# Patient Record
Sex: Female | Born: 1985 | Race: Black or African American | Hispanic: No | Marital: Single | Smoking: Never smoker
Health system: Southern US, Community
[De-identification: ages and names within clinical notes are randomized; demographics above are authoritative.]

## PROBLEM LIST (undated history)

## (undated) HISTORY — PX: ELBOW FRACTURE SURGERY: SHX616

---

## 2009-07-27 ENCOUNTER — Emergency Department (HOSPITAL_BASED_OUTPATIENT_CLINIC_OR_DEPARTMENT_OTHER): Admission: EM | Admit: 2009-07-27 | Discharge: 2009-07-27 | Payer: Self-pay | Admitting: Emergency Medicine

## 2009-07-27 ENCOUNTER — Ambulatory Visit: Payer: Self-pay | Admitting: Radiology

## 2012-12-24 ENCOUNTER — Emergency Department (HOSPITAL_BASED_OUTPATIENT_CLINIC_OR_DEPARTMENT_OTHER): Payer: Self-pay

## 2012-12-24 ENCOUNTER — Encounter (HOSPITAL_BASED_OUTPATIENT_CLINIC_OR_DEPARTMENT_OTHER): Payer: Self-pay | Admitting: *Deleted

## 2012-12-24 ENCOUNTER — Emergency Department (HOSPITAL_BASED_OUTPATIENT_CLINIC_OR_DEPARTMENT_OTHER)
Admission: EM | Admit: 2012-12-24 | Discharge: 2012-12-24 | Disposition: A | Discharge status: Home / Self Care | Payer: Self-pay | Attending: Emergency Medicine | Admitting: Emergency Medicine

## 2012-12-24 DIAGNOSIS — M654 Radial styloid tenosynovitis [de Quervain]: Secondary | ICD-10-CM | POA: Insufficient documentation

## 2012-12-24 MED ORDER — IBUPROFEN 800 MG PO TABS: 800.0000 mg | ORAL_TABLET | Freq: Three times a day (TID) | ORAL | Status: AC

## 2012-12-24 NOTE — ED Notes (Signed)
Left wrist has been hurting for the past 2 wqeeks, worsening now with movement. Denies recent injury

## 2012-12-25 NOTE — ED Provider Notes (Signed)
History     CSN: [id number]  Arrival date & time 12/24/12  4098   First MD Initiated Contact with Patient 12/24/12 2132      Chief Complaint  Patient presents with  . Wrist Pain    (Consider location/radiation/quality/duration/timing/severity/associated sxs/prior treatment) HPI Comments: L wrist pain that has been recurring over the past 2 weeks. No injury or fall. Worse with certain movements. No fever, chills, weakness, numbness, tingling.  No other joint pain.  R handed.   The history is provided by the patient.    History reviewed. No pertinent past medical history.  History reviewed. No pertinent past surgical history.  No family history on file.  History  Substance Use Topics  . Smoking status: Never Smoker   . Smokeless tobacco: Not on file  . Alcohol Use: No    OB History   Grav Para Term Preterm Abortions TAB SAB Ect Mult Living                  Review of Systems  Constitutional: Negative for fever.  Gastrointestinal: Negative for vomiting.  Musculoskeletal: Positive for myalgias and arthralgias.  Neurological: Negative for weakness and headaches.   A complete 10 system review of systems was obtained and all systems are negative except as noted in the HPI and PMH.   Allergies  Review of patient's allergies indicates no known allergies.  Home Medications   Current Outpatient Rx  Name  Route  Sig  Dispense  Refill  . ibuprofen (ADVIL,MOTRIN) 800 MG tablet   Oral   Take 1 tablet (800 mg total) by mouth 3 (three) times daily.   21 tablet   0     BP 131/82  Pulse 96  Temp(Src) 98.5 F (36.9 C) (Oral)  Resp 16  SpO2 99%  LMP 12/17/2012  Physical Exam  Constitutional: She is oriented to person, place, and time. She appears well-developed and well-nourished. No distress.  HENT:  Head: Normocephalic and atraumatic.  Mouth/Throat: Oropharynx is clear and moist. No oropharyngeal exudate.  Eyes: Conjunctivae and EOM are normal. Pupils are equal,  round, and reactive to light.  Neck: Normal range of motion.  Cardiovascular: Normal rate, regular rhythm and normal heart sounds.   Pulmonary/Chest: Effort normal and breath sounds normal. No respiratory distress.  Abdominal: Soft. There is no tenderness. There is no rebound and no guarding.  Musculoskeletal: Normal range of motion. She exhibits tenderness. She exhibits no edema.  TTP L radial wrist without deformity or erythema. +2 radial pulse, cardinal hand movements intact. Wrist flexion and extension intact. Finkelstein test positive.  Neurological: She is alert and oriented to person, place, and time. No cranial nerve deficit. She exhibits normal muscle tone. Coordination normal.    ED Course  Procedures (including critical care time)  Labs Reviewed - No data to display Dg Wrist Complete Left  12/24/2012  *RADIOLOGY REPORT*  Clinical Data: Left wrist pain for a few weeks, no known injury  LEFT WRIST - COMPLETE 3+ VIEW  Comparison: None  Findings: Bone mineralization normal. Joint spaces preserved. No fracture, dislocation, or bone destruction.  IMPRESSION: Normal exam.   Original Report Authenticated By: Ulyses Southward, M.D.      1. De Quervain's tenosynovitis       MDM  L wrist pain without injury. Nontoxic appearing. Vitals stable.  Probable de Quervain's tenosynovitis.  NSAIDs, splint, f/u sports med.        Glynn Octave, MD 12/25/12 1159

## 2014-08-12 IMAGING — CR DG WRIST COMPLETE 3+V*L*
4 series · 4 of 4 positions shown · non-contrast
Comparison: None

CLINICAL DATA: Left wrist pain for a few weeks, no known injury

LEFT WRIST - COMPLETE 3+ VIEW

[x wrist pa left]
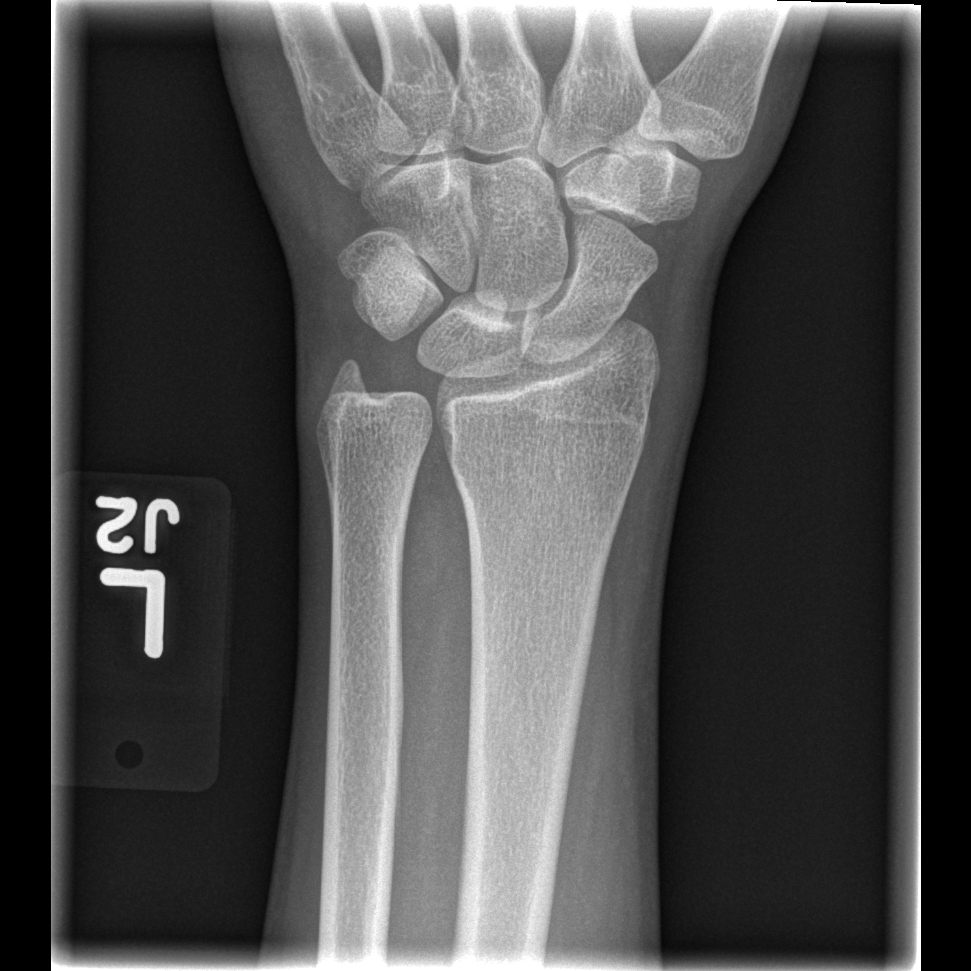

[x wrist obl left]
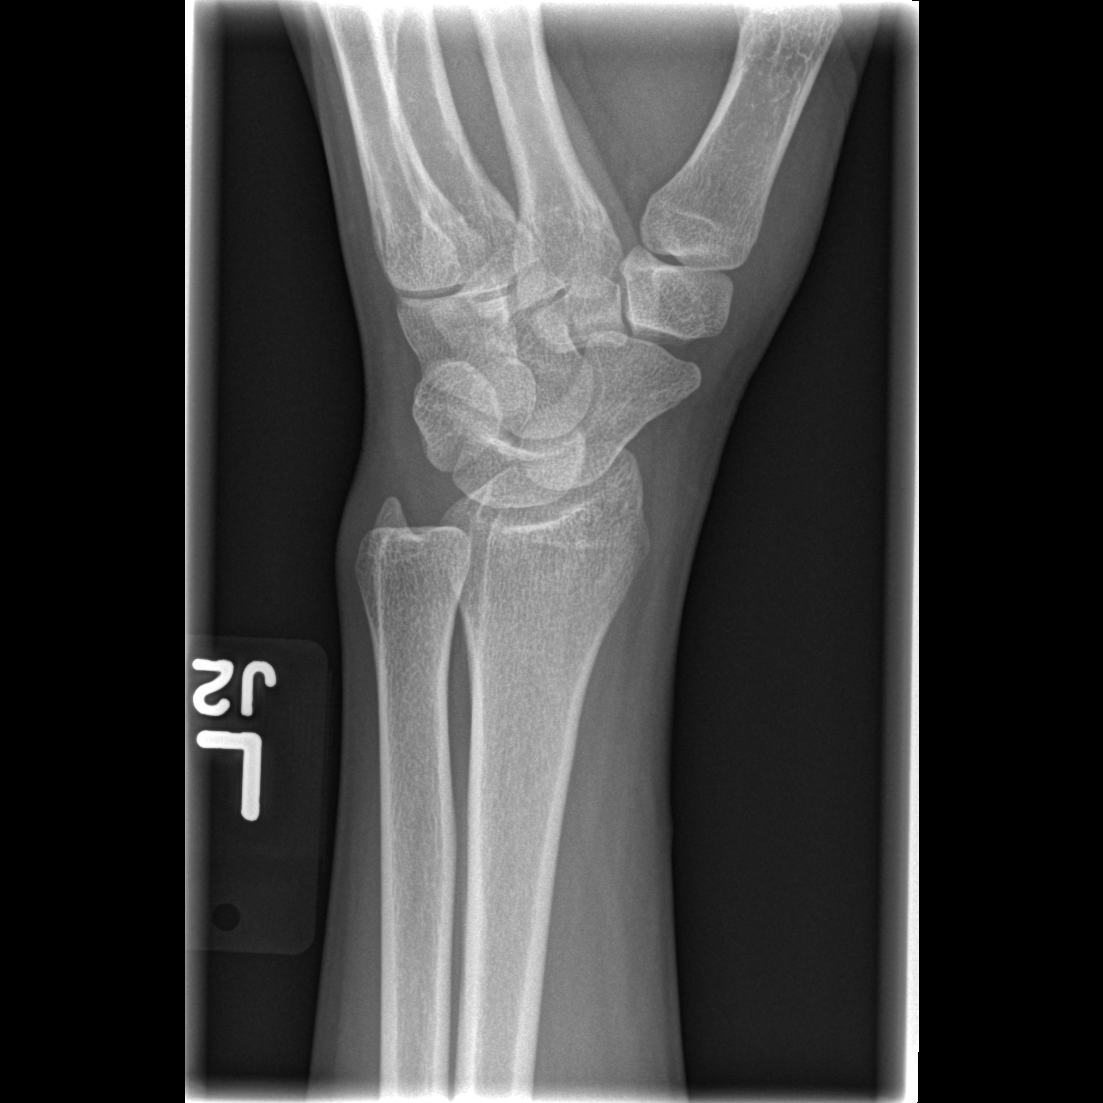

[x wrist lat left]
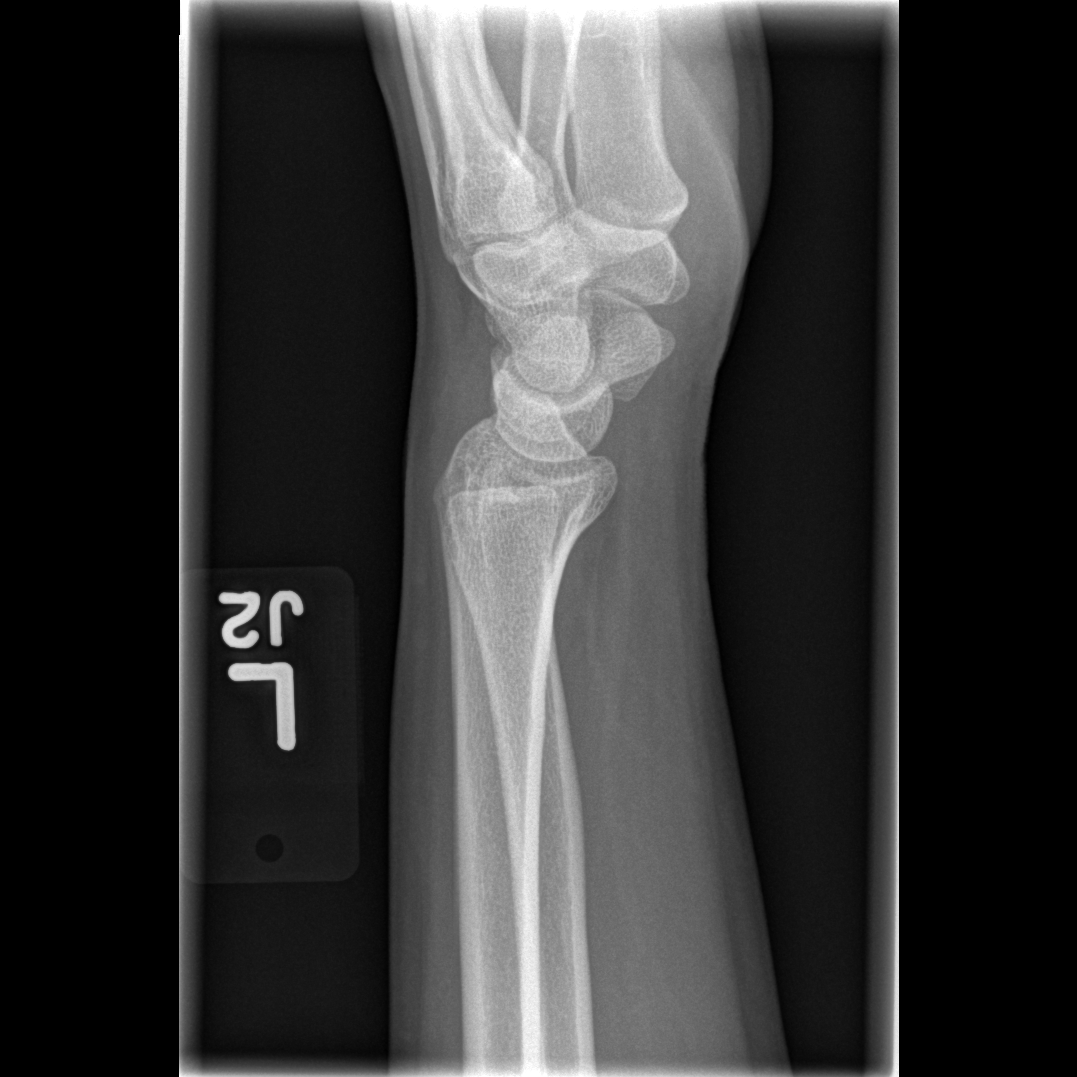

[x navicular]
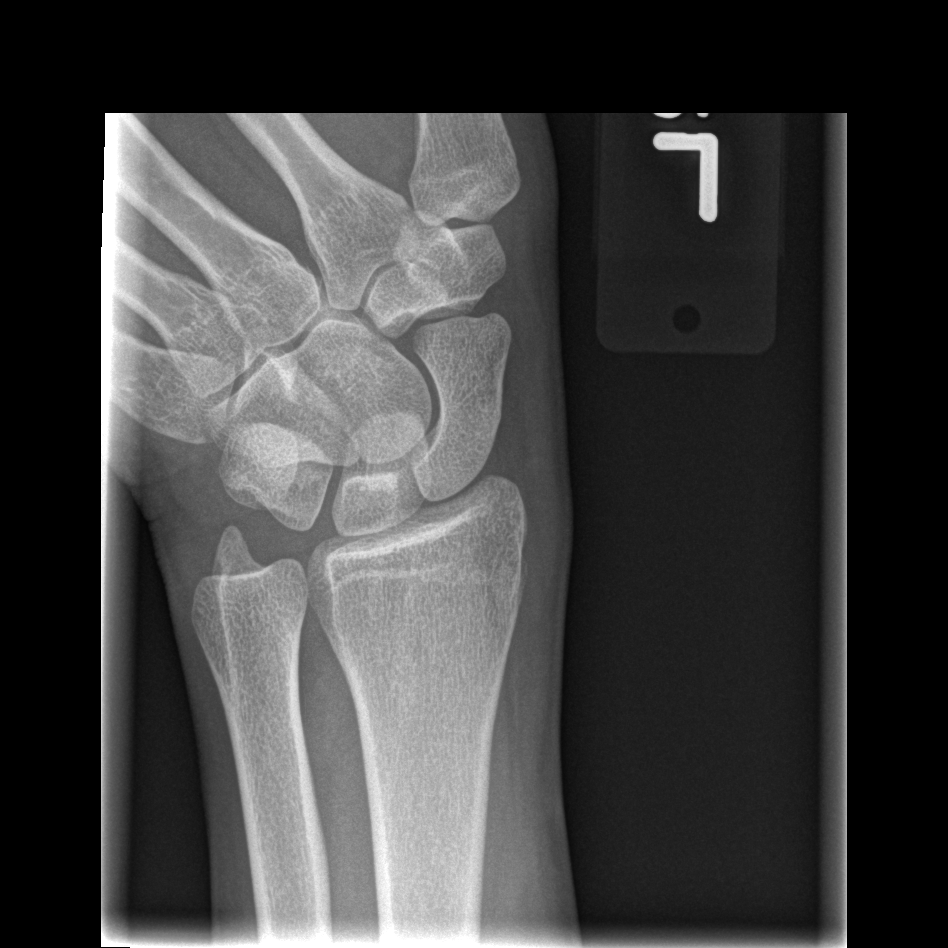

[4 of 4 positions shown; findings below may reference images not displayed]

FINDINGS: Bone mineralization normal.
Joint spaces preserved.
No fracture, dislocation, or bone destruction.
IMPRESSION: Normal exam.

## 2014-09-13 ENCOUNTER — Emergency Department (HOSPITAL_BASED_OUTPATIENT_CLINIC_OR_DEPARTMENT_OTHER)
Admission: EM | Admit: 2014-09-13 | Discharge: 2014-09-13 | Disposition: A | Discharge status: Home / Self Care | Payer: Medicaid Other | Attending: Emergency Medicine | Admitting: Emergency Medicine

## 2014-09-13 ENCOUNTER — Emergency Department (HOSPITAL_BASED_OUTPATIENT_CLINIC_OR_DEPARTMENT_OTHER): Payer: Medicaid Other

## 2014-09-13 ENCOUNTER — Encounter (HOSPITAL_BASED_OUTPATIENT_CLINIC_OR_DEPARTMENT_OTHER): Payer: Self-pay | Admitting: *Deleted

## 2014-09-13 DIAGNOSIS — Z791 Long term (current) use of non-steroidal anti-inflammatories (NSAID): Secondary | ICD-10-CM | POA: Diagnosis not present

## 2014-09-13 DIAGNOSIS — R059 Cough, unspecified: Secondary | ICD-10-CM

## 2014-09-13 DIAGNOSIS — R05 Cough: Secondary | ICD-10-CM

## 2014-09-13 DIAGNOSIS — J40 Bronchitis, not specified as acute or chronic: Secondary | ICD-10-CM

## 2014-09-13 DIAGNOSIS — J209 Acute bronchitis, unspecified: Secondary | ICD-10-CM | POA: Insufficient documentation

## 2014-09-13 MED ORDER — AZITHROMYCIN 250 MG PO TABS: 250.0000 mg | ORAL_TABLET | Freq: Every day | ORAL | Status: DC

## 2014-09-13 MED ORDER — ALBUTEROL SULFATE HFA 108 (90 BASE) MCG/ACT IN AERS
2.0000 | INHALATION_SPRAY | Freq: Once | RESPIRATORY_TRACT | Status: AC
  Administered 2014-09-13: 2 via RESPIRATORY_TRACT
  Filled 2014-09-13: qty 6.7

## 2014-09-13 NOTE — ED Provider Notes (Signed)
CSN: [id number]     Arrival date & time 09/13/14  1614 History   First MD Initiated Contact with Patient 09/13/14 1716     Chief Complaint  Patient presents with  . Cough     (Consider location/radiation/quality/duration/timing/severity/associated sxs/prior Treatment) Patient is a 28 y.o. female presenting with cough. The history is provided by the patient. No language interpreter was used.  Cough Cough characteristics:  Productive Sputum characteristics:  Nondescript Severity:  Moderate Onset quality:  Gradual Timing:  Constant Progression:  Worsening Chronicity:  New Smoker: no   Context: upper respiratory infection   Relieved by:  Nothing Worsened by:  Nothing tried Risk factors: recent infection    Pt complains of a cough, sore throat and runny nose History reviewed. No pertinent past medical history. Past Surgical History  Procedure Laterality Date  . Elbow fracture surgery     No family history on file. History  Substance Use Topics  . Smoking status: Never Smoker   . Smokeless tobacco: Never Used  . Alcohol Use: No   OB History    No data available     Review of Systems  Respiratory: Positive for cough.   All other systems reviewed and are negative.     Allergies  Review of patient's allergies indicates no known allergies.  Home Medications   Prior to Admission medications   Medication Sig Start Date End Date Taking? Authorizing Provider  ibuprofen (ADVIL,MOTRIN) 800 MG tablet Take 1 tablet (800 mg total) by mouth 3 (three) times daily. 12/24/12   Glynn OctaveStephen Rancour, MD   BP 144/89 mmHg  Pulse 86  Temp(Src) 99 F (37.2 C) (Oral)  Resp 18  Ht 5\' 5"  (1.651 m)  Wt 165 lb (74.844 kg)  BMI 27.46 kg/m2  SpO2 100%  LMP 08/13/2014 Physical Exam  Constitutional: She is oriented to person, place, and time. She appears well-developed and well-nourished.  HENT:  Head: Normocephalic and atraumatic.  Right Ear: External ear normal.  Left Ear: External  ear normal.  Nose: Nose normal.  Mouth/Throat: Oropharynx is clear and moist.  Eyes: Conjunctivae are normal. Pupils are equal, round, and reactive to light.  Neck: Normal range of motion. Neck supple.  Cardiovascular: Normal rate and regular rhythm.   Pulmonary/Chest: Effort normal.  Neurological: She is alert and oriented to person, place, and time. She has normal reflexes.  Skin: Skin is warm.  Psychiatric: She has a normal mood and affect.  Nursing note and vitals reviewed.   ED Course  Procedures (including critical care time) Labs Review Labs Reviewed - No data to display  Imaging Review No results found.   EKG Interpretation None     Albuterol inhaler  Zithromax  MDM   Final diagnoses:  Bronchitis        Elson AreasLeslie K Maurina Fawaz, PA-C 09/13/14 Rickey [id number]  Mirian MoMatthew Gentry, MD 09/19/14 [phone]

## 2014-09-13 NOTE — Discharge Instructions (Signed)

## 2014-09-13 NOTE — ED Notes (Signed)
PA at bedside.

## 2014-09-13 NOTE — ED Notes (Signed)
Cough, runny nose, sore throat since friday

## 2015-01-19 ENCOUNTER — Emergency Department (HOSPITAL_BASED_OUTPATIENT_CLINIC_OR_DEPARTMENT_OTHER)
Admission: EM | Admit: 2015-01-19 | Discharge: 2015-01-19 | Disposition: A | Discharge status: Home / Self Care | Payer: Medicaid Other | Attending: Emergency Medicine | Admitting: Emergency Medicine

## 2015-01-19 DIAGNOSIS — Z791 Long term (current) use of non-steroidal anti-inflammatories (NSAID): Secondary | ICD-10-CM | POA: Insufficient documentation

## 2015-01-19 DIAGNOSIS — K088 Other specified disorders of teeth and supporting structures: Secondary | ICD-10-CM | POA: Insufficient documentation

## 2015-01-19 DIAGNOSIS — K0889 Other specified disorders of teeth and supporting structures: Secondary | ICD-10-CM

## 2015-01-19 MED ORDER — BUPIVACAINE-EPINEPHRINE (PF) 0.5% -1:200000 IJ SOLN: 1.8000 mL | Freq: Once | INTRAMUSCULAR | Status: DC

## 2015-01-19 MED ORDER — PENICILLIN V POTASSIUM 500 MG PO TABS: 500.0000 mg | ORAL_TABLET | Freq: Four times a day (QID) | ORAL | Status: AC

## 2015-01-19 MED ORDER — BUPIVACAINE-EPINEPHRINE (PF) 0.5% -1:200000 IJ SOLN
INTRAMUSCULAR | Status: AC
  Filled 2015-01-19: qty 1.8

## 2015-01-19 MED ORDER — TRAMADOL HCL 50 MG PO TABS: 50.0000 mg | ORAL_TABLET | Freq: Four times a day (QID) | ORAL | Status: AC | PRN

## 2015-01-19 NOTE — ED Provider Notes (Signed)
CSN: [id number]     Arrival date & time 01/19/15  1025 History   First MD Initiated Contact with Patient 01/19/15 1200     No chief complaint on file.    (Consider location/radiation/quality/duration/timing/severity/associated sxs/prior Treatment) HPI Mackenzie Serrano is a 29 y.o. female who comes in for evaluation of dental pain. Patient states since Friday she has had discomfort in her left jaw that she attributes to dental pain in her left upper and lower molars. She called her dentist but was unable to get an appointment before April 20. She has tried ibuprofen, salt water gargles and peroxide without relief. She rates her discomfort as a constant aching sensation that is 7/10. She denies any fevers, drooling,difficulty breathing, difficulty swallowing, chest pain, sore throat. No other aggravating or modifying factors.  No past medical history on file. Past Surgical History  Procedure Laterality Date  . Elbow fracture surgery     No family history on file. History  Substance Use Topics  . Smoking status: Never Smoker   . Smokeless tobacco: Never Used  . Alcohol Use: No   OB History    No data available     Review of Systems  Constitutional: Negative for fever.  HENT: Positive for dental problem. Negative for drooling.   Respiratory: Negative for shortness of breath.   Cardiovascular: Negative for chest pain.  Skin: Negative for rash.      Allergies  Review of patient's allergies indicates no known allergies.  Home Medications   Prior to Admission medications   Medication Sig Start Date End Date Taking? Authorizing Provider  ibuprofen (ADVIL,MOTRIN) 800 MG tablet Take 1 tablet (800 mg total) by mouth 3 (three) times daily. 12/24/12   Glynn OctaveStephen Rancour, MD  penicillin v potassium (VEETID) 500 MG tablet Take 1 tablet (500 mg total) by mouth 4 (four) times daily. 01/19/15 01/26/15  Joycie PeekBenjamin Raahil Ong, PA-C  traMADol (ULTRAM) 50 MG tablet Take 1 tablet (50 mg total) by mouth every 6  (six) hours as needed. 01/19/15   Colisha Redler, PA-C   BP 130/71 mmHg  Pulse 64  Temp(Src) 98.2 F (36.8 C) (Oral)  Resp 14  Ht 5\' 5"  (1.651 m)  Wt 155 lb (70.308 kg)  BMI 25.79 kg/m2  SpO2 100% Physical Exam  Constitutional:  Awake, alert, nontoxic appearance.  HENT:  Head: Atraumatic.  Discomfort located to lower left molars and premolars. Active caries. Mucous membranes are moist. No unilateral tonsillar swelling, uvula midline, no glossal swelling or elevation. No trismus. No fluctuance or evidence of a drainable abscess. No other evidence of emergent infection, Retropharyngeal or Peritonsillar abscess, Ludwig or Vincents angina. Tolerating secretions well. Patent airway   Eyes: Right eye exhibits no discharge. Left eye exhibits no discharge.  Neck: Neck supple.  Pulmonary/Chest: Effort normal. She exhibits no tenderness.  Abdominal: Soft. There is no tenderness. There is no rebound.  Musculoskeletal: She exhibits no tenderness.  Baseline ROM, no obvious new focal weakness.  Neurological:  Mental status and motor strength appears baseline for patient and situation.  Skin: No rash noted.  Psychiatric: She has a normal mood and affect.  Nursing note and vitals reviewed.   ED Course  Procedures (including critical care time) NERVE BLOCK Performed by: Sharlene Mottsartner, Dax Murguia W Consent: Verbal consent obtained. Required items: required blood products, implants, devices, and special equipment available Time out: Immediately prior to procedure a "time out" was called to verify the correct patient, procedure, equipment, support staff and site/side marked as required.  Indication: dental  pain Nerve block body site: left inferior alveolar  Preparation: Patient was prepped and draped in the usual sterile fashion. Needle gauge: 24 G Location technique: anatomical landmarks  Local anesthetic: bupivacaine  Anesthetic total: bupivacaine 1.8 ml  Outcome: pain improved Patient  tolerance: Patient tolerated the procedure well with no immediate complications.  Labs Review Labs Reviewed - No data to display  Imaging Review No results found.   EKG Interpretation None     Meds given in ED:  Medications  bupivacaine-epinephrine (MARCAINE W/ EPI) 0.5% -1:200000 injection 1.8 mL (not administered)    New Prescriptions   PENICILLIN V POTASSIUM (VEETID) 500 MG TABLET    Take 1 tablet (500 mg total) by mouth 4 (four) times daily.   TRAMADOL (ULTRAM) 50 MG TABLET    Take 1 tablet (50 mg total) by mouth every 6 (six) hours as needed.   Filed Vitals:   01/19/15 1029  BP: 130/71  Pulse: 64  Temp: 98.2 F (36.8 C)  TempSrc: Oral  Resp: 14  Height:  (1.651 m)  Weight: 155 lb (70.308 kg)  SpO2: 100%    MDM  Vitals stable - WNL -afebrile Pt resting comfortably in ED. Reports resolution of pain following oral nerve block bleeding in ED. PE--grossly benign physical exam/dental exam. No evidence of retropharyngeal, peritonsillar abscess, Vincent's or Ludwig angina.  DDX--patient discharge with antibiotics, short course tramadol for associated dental discomfort. Encouraged to return to ibuprofen, salt water gargles for symptomatic relief at home. Encouraged her to follow-up with her dentist for regularly scheduled appointment on April 20. Return to ED for worsening symptoms.  I discussed all relevant lab findings and imaging results with pt and they verbalized understanding. Discussed f/u with PCP within 48 hrs and return precautions, pt very amenable to plan.  Final diagnoses:  Pain, dental        Joycie Peek, PA-C 01/19/15 1304  Vanetta Mulders, MD 01/20/15 [phone]

## 2015-01-19 NOTE — ED Notes (Signed)
Presents to ED for c/o "toothache" states has swelling on gums since friday

## 2015-01-19 NOTE — Discharge Instructions (Signed)
Dental Pain °A tooth ache may be caused by cavities (tooth decay). Cavities expose the nerve of the tooth to air and hot or cold temperatures. It may come from an infection or abscess (also called a boil or furuncle) around your tooth. It is also often caused by dental caries (tooth decay). This causes the pain you are having. °DIAGNOSIS  °Your caregiver can diagnose this problem by exam. °TREATMENT  °· If caused by an infection, it may be treated with medications which kill germs (antibiotics) and pain medications as prescribed by your caregiver. Take medications as directed. °· Only take over-the-counter or prescription medicines for pain, discomfort, or fever as directed by your caregiver. °· Whether the tooth ache today is caused by infection or dental disease, you should see your dentist as soon as possible for further care. °SEEK MEDICAL CARE IF: °The exam and treatment you received today has been provided on an emergency basis only. This is not a substitute for complete medical or dental care. If your problem worsens or new problems (symptoms) appear, and you are unable to meet with your dentist, call or return to this location. °SEEK IMMEDIATE MEDICAL CARE IF:  °· You have a fever. °· You develop redness and swelling of your face, jaw, or neck. °· You are unable to open your mouth. °· You have severe pain uncontrolled by pain medicine. °MAKE SURE YOU:  °· Understand these instructions. °· Will watch your condition. °· Will get help right away if you are not doing well or get worse. °Document Released: 10/01/2005 Document Revised: 12/24/2011 Document Reviewed: 05/19/2008 °ExitCare® Patient Information ©2015 [employer]. This information is not intended to replace advice given to you by your health care provider. Make sure you discuss any questions you have with your health care provider. ° °Dental Care and Dentist Visits °Dental care supports good overall health. Regular dental visits can also help you  avoid dental pain, bleeding, infection, and other more serious health problems in the future. It is important to keep the mouth healthy because diseases in the teeth, gums, and other oral tissues can spread to other areas of the body. Some problems, such as diabetes, heart disease, and pre-term labor have been associated with poor oral health.  °See your dentist every 6 months. If you experience emergency problems such as a toothache or broken tooth, go to the dentist right away. If you see your dentist regularly, you may catch problems early. It is easier to be treated for problems in the early stages.  °WHAT TO EXPECT AT A DENTIST VISIT  °Your dentist will look for many common oral health problems and recommend proper treatment. At your regular dental visit, you can expect: °· Gentle cleaning of the teeth and gums. This includes scraping and polishing. This helps to remove the sticky substance around the teeth and gums (plaque). Plaque forms in the mouth shortly after eating. Over time, plaque hardens on the teeth as tartar. If tartar is not removed regularly, it can cause problems. Cleaning also helps remove stains. °· Periodic X-rays. These pictures of the teeth and supporting bone will help your dentist assess the health of your teeth. °· Periodic fluoride treatments. Fluoride is a natural mineral shown to help strengthen teeth. Fluoride treatment involves applying a fluoride gel or varnish to the teeth. It is most commonly done in children. °· Examination of the mouth, tongue, jaws, teeth, and gums to look for any oral health problems, such as: °¨ Cavities (dental caries). This is   decay on the tooth caused by plaque, sugar, and acid in the mouth. It is best to catch a cavity when it is small.  Inflammation of the gums caused by plaque buildup (gingivitis).  Problems with the mouth or malformed or misaligned teeth.  Oral cancer or other diseases of the soft tissues or jaws. KEEP YOUR TEETH AND GUMS  HEALTHY For healthy teeth and gums, follow these general guidelines as well as your dentist's specific advice:  Have your teeth professionally cleaned at the dentist every 6 months.  Brush twice daily with a fluoride toothpaste.  Floss your teeth daily.  Ask your dentist if you need fluoride supplements, treatments, or fluoride toothpaste.  Eat a healthy diet. Reduce foods and drinks with added sugar.  Avoid smoking. TREATMENT FOR ORAL HEALTH PROBLEMS If you have oral health problems, treatment varies depending on the conditions present in your teeth and gums.  Your caregiver will most likely recommend good oral hygiene at each visit.  For cavities, gingivitis, or other oral health disease, your caregiver will perform a procedure to treat the problem. This is typically done at a separate appointment. Sometimes your caregiver will refer you to another dental specialist for specific tooth problems or for surgery. SEEK IMMEDIATE DENTAL CARE IF:  You have pain, bleeding, or soreness in the gum, tooth, jaw, or mouth area.  A permanent tooth becomes loose or separated from the gum socket.  You experience a blow or injury to the mouth or jaw area. Document Released: 06/13/2011 Document Revised: 12/24/2011 Document Reviewed: 06/13/2011 [facility]Exit[facility] Patient Information 2015 [place], [place]. This information is not intended to replace advice given to you by your health care provider. Make sure you discuss any questions you have with your health care provider.   Please take your medications as directed. Please complete your antibiotics. Do not take your pain medicines prior to driving or operating machinery. Follow up with your dentist for definitive care. Return to ED for new or worsening symptoms.

## 2015-01-19 NOTE — ED Notes (Signed)
Pt states has tried Ibuprofen, warm saltwater, even peroxide for control of pain, without relief, call primary dentist today, was told appoint unavailable till later part of april

## 2015-01-19 NOTE — ED Notes (Signed)
No difficulty in speech or swallowing noted, no visible facial swelling noted at this assessment

## 2016-05-01 IMAGING — CR DG CHEST 2V
2 series · 2 of 2 positions shown · non-contrast
Comparison: 07/27/2009.

CLINICAL DATA: Patient states she has had a cough, runny nose and
sore throat since [REDACTED].

EXAM:
CHEST  2 VIEW

[w chest pa]
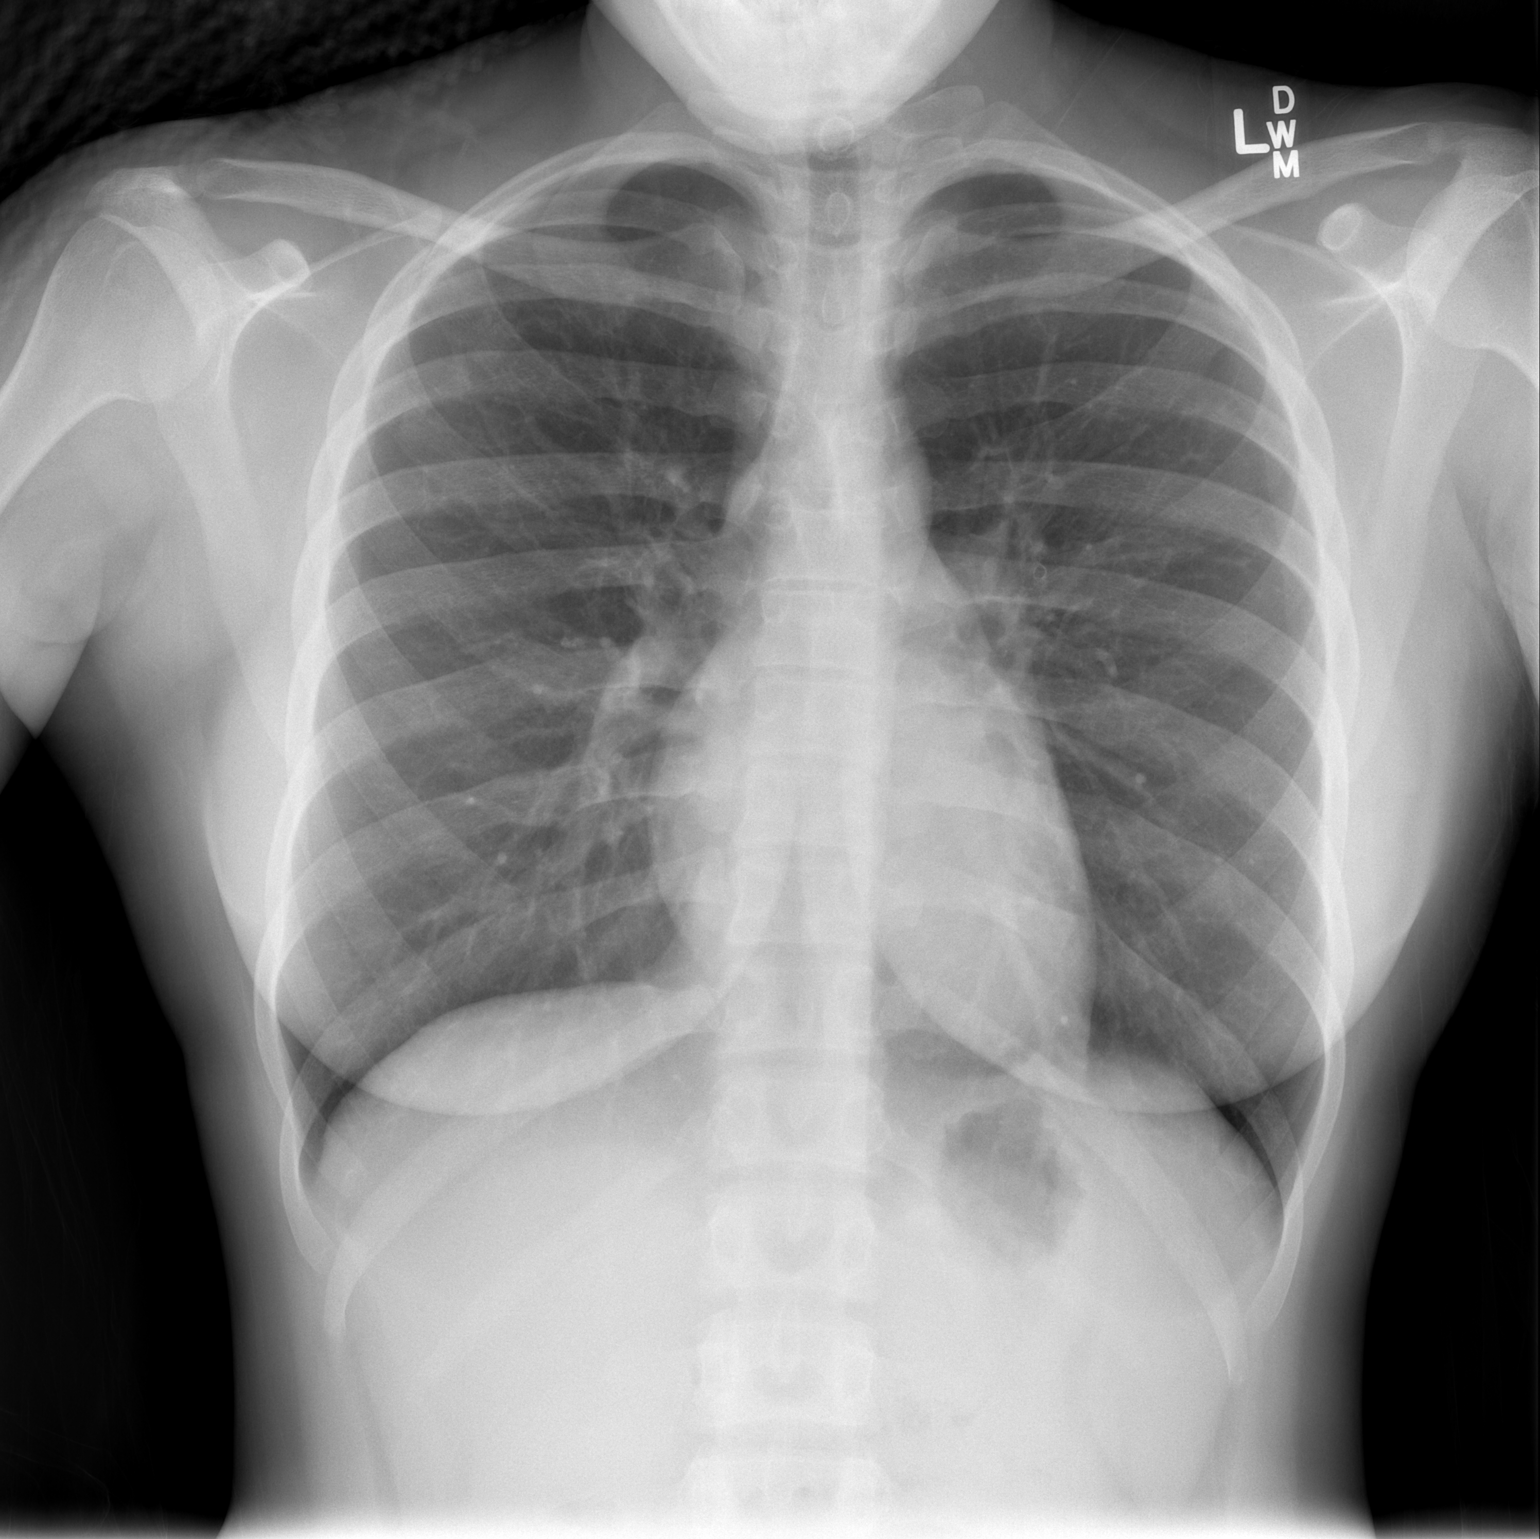

[w chest lat]
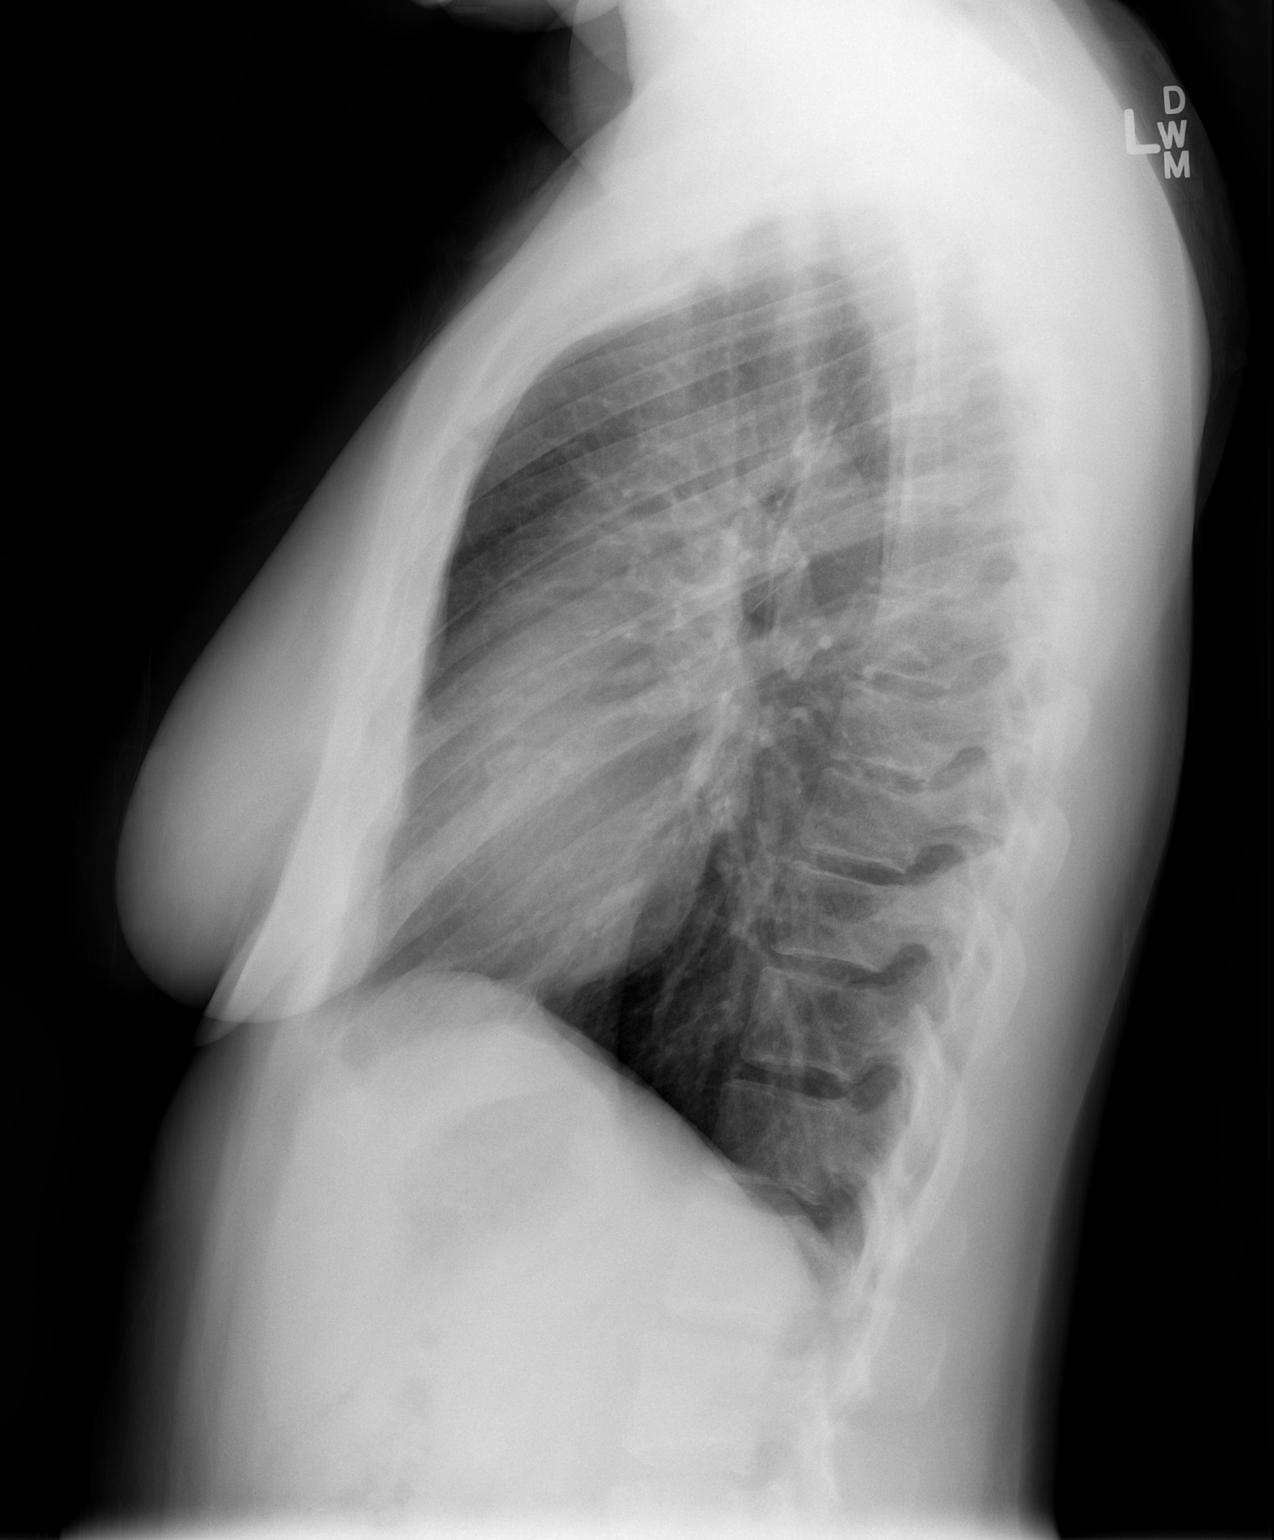

[2 of 2 positions shown; findings below may reference images not displayed]

FINDINGS: Trachea is midline. Heart size normal. Lungs are clear. No pleural
fluid.
IMPRESSION: Negative.

## 2017-01-24 ENCOUNTER — Encounter (HOSPITAL_COMMUNITY): Payer: Self-pay | Admitting: Emergency Medicine

## 2017-01-24 ENCOUNTER — Ambulatory Visit (HOSPITAL_COMMUNITY)
Admission: EM | Admit: 2017-01-24 | Discharge: 2017-01-24 | Disposition: A | Discharge status: Home / Self Care | Payer: Medicaid Other | Attending: Family Medicine | Admitting: Family Medicine

## 2017-01-24 DIAGNOSIS — S50361A Insect bite (nonvenomous) of right elbow, initial encounter: Secondary | ICD-10-CM

## 2017-01-24 DIAGNOSIS — L539 Erythematous condition, unspecified: Secondary | ICD-10-CM

## 2017-01-24 DIAGNOSIS — W57XXXA Bitten or stung by nonvenomous insect and other nonvenomous arthropods, initial encounter: Secondary | ICD-10-CM

## 2017-01-24 DIAGNOSIS — L299 Pruritus, unspecified: Secondary | ICD-10-CM

## 2017-01-24 MED ORDER — TRIAMCINOLONE ACETONIDE 0.1 % EX CREA: 1.0000 "application " | TOPICAL_CREAM | Freq: Two times a day (BID) | CUTANEOUS | 0 refills | Status: AC

## 2017-01-24 NOTE — ED Triage Notes (Signed)
Here for poss insect bite to right arm onset yest  Reports she felt a sting but did not see the insect  Sx today include: swelling, redness and localized fever  Denies dyspnea, dysphagia  Has applied OTC hydrocortisone cream w/temp relief.   A&O x4... NAD

## 2017-01-24 NOTE — Discharge Instructions (Signed)
Apply the triamcinolone cream to the affected area twice a day. He may also apply Benadryl cream every 4 hours if needed for itching and swelling. Also apply cold compresses. This should go away saying. She did develop generalized swelling, rash, hives, trouble breathing, wheezing or swelling in the mouth or tongue he should seek medical attention promptly.

## 2017-01-24 NOTE — ED Provider Notes (Signed)
CSN: [id number]     Arrival date & time 01/24/17  1107 History   First MD Initiated Contact with Patient 01/24/17 1148     Chief Complaint  Patient presents with  . Insect Bite   (Consider location/radiation/quality/duration/timing/severity/associated sxs/prior Treatment) 31 year old female states she was at work early yesterday morning around 7 AM and she felt some sort of interesting to the right elbow. Shortly afterwards there was growing erythema and itching around the right elbow. The itching and redness has persisted through today. Denies systemic symptoms. No swelling, hives, trouble breathing, cough. He did apply cold compresses which helped significantly as well as over-the-counter cortisone cream which also helped temporarily.      History reviewed. No pertinent past medical history. Past Surgical History:  Procedure Laterality Date  . ELBOW FRACTURE SURGERY     No family history on file. Social History  Substance Use Topics  . Smoking status: Never Smoker  . Smokeless tobacco: Never Used  . Alcohol use Yes   OB History    No data available     Review of Systems  Constitutional: Negative.   HENT: Negative.  Negative for congestion and rhinorrhea.   Respiratory: Negative.  Negative for cough, shortness of breath and wheezing.   Cardiovascular: Negative for chest pain.  Skin: Positive for color change. Negative for wound.  Neurological: Negative.   All other systems reviewed and are negative.   Allergies  Patient has no known allergies.  Home Medications   Prior to Admission medications   Medication Sig Start Date End Date Taking? Authorizing Provider  ibuprofen (ADVIL,MOTRIN) 800 MG tablet Take 1 tablet (800 mg total) by mouth 3 (three) times daily. 12/24/12   Glynn Octave, MD  traMADol (ULTRAM) 50 MG tablet Take 1 tablet (50 mg total) by mouth every 6 (six) hours as needed. 01/19/15   Joycie Peek, PA-C  triamcinolone cream (KENALOG) 0.1 % Apply 1  application topically 2 (two) times daily. 01/24/17   Hayden Rasmussen, NP   Meds Ordered and Administered this Visit  Medications - No data to display  BP 137/89 (BP Location: Right Arm)   Pulse 91   Temp 98.6 F (37 C) (Oral)   Resp 16   LMP 01/24/2017   SpO2 100%  No data found.   Physical Exam  Constitutional: She is oriented to person, place, and time. She appears well-developed and well-nourished. No distress.  HENT:  No intraoral edema. Voice is normal.  Eyes: EOM are normal.  Neck: Neck supple.  Cardiovascular: Normal rate and regular rhythm.   Pulmonary/Chest: Effort normal. No respiratory distress. She has no wheezes.  Musculoskeletal: She exhibits no edema.  Neurological: She is alert and oriented to person, place, and time. She exhibits normal muscle tone.  Skin: Skin is warm and dry. No rash noted.  There is a circle of cutaneous erythema surrounding the right elbow extending into the proximal forearm and distal upper arm. No lymphangitis. No tenderness or pain. No open skin lesions are visible. No local swelling.  Psychiatric: She has a normal mood and affect.  Nursing note and vitals reviewed.   Urgent Care Course     Procedures (including critical care time)  Labs Review Labs Reviewed - No data to display  Imaging Review No results found.   Visual Acuity Review  Right Eye Distance:   Left Eye Distance:   Bilateral Distance:    Right Eye Near:   Left Eye Near:    Bilateral Near:  MDM   1. Insect bite, initial encounter    Apply the triamcinolone cream to the affected area twice a day. He may also apply Benadryl cream every 4 hours if needed for itching and swelling. Also apply cold compresses. This should go away saying. She did develop generalized swelling, rash, hives, trouble breathing, wheezing or swelling in the mouth or tongue he should seek medical attention promptly. Meds ordered this encounter  Medications  . triamcinolone cream  (KENALOG) 0.1 %    Sig: Apply 1 application topically 2 (two) times daily.    Dispense:  15 g    Refill:  0    Order Specific Question:   Supervising Provider    Answer:   Lonia Blood       Hayden Rasmussen, NP 01/24/17 1200

## 2017-01-31 ENCOUNTER — Emergency Department (HOSPITAL_BASED_OUTPATIENT_CLINIC_OR_DEPARTMENT_OTHER)
Admission: EM | Admit: 2017-01-31 | Discharge: 2017-01-31 | Disposition: A | Discharge status: Home / Self Care | Payer: Medicaid Other | Attending: Emergency Medicine | Admitting: Emergency Medicine

## 2017-01-31 ENCOUNTER — Encounter (HOSPITAL_BASED_OUTPATIENT_CLINIC_OR_DEPARTMENT_OTHER): Payer: Self-pay | Admitting: *Deleted

## 2017-01-31 DIAGNOSIS — Z79899 Other long term (current) drug therapy: Secondary | ICD-10-CM | POA: Insufficient documentation

## 2017-01-31 DIAGNOSIS — F1290 Cannabis use, unspecified, uncomplicated: Secondary | ICD-10-CM | POA: Insufficient documentation

## 2017-01-31 DIAGNOSIS — L03113 Cellulitis of right upper limb: Secondary | ICD-10-CM | POA: Insufficient documentation

## 2017-01-31 MED ORDER — CEPHALEXIN 500 MG PO CAPS: 500.0000 mg | ORAL_CAPSULE | Freq: Four times a day (QID) | ORAL | 0 refills | Status: AC

## 2017-01-31 MED ORDER — DIPHENHYDRAMINE HCL 50 MG/ML IJ SOLN
25.0000 mg | Freq: Once | INTRAMUSCULAR | Status: AC
  Administered 2017-01-31: 25 mg via INTRAMUSCULAR
  Filled 2017-01-31: qty 1

## 2017-01-31 MED ORDER — PREDNISONE 10 MG PO TABS: 20.0000 mg | ORAL_TABLET | Freq: Every day | ORAL | 0 refills | Status: AC

## 2017-01-31 MED ORDER — DEXAMETHASONE SODIUM PHOSPHATE 10 MG/ML IJ SOLN
10.0000 mg | Freq: Once | INTRAMUSCULAR | Status: AC
  Administered 2017-01-31: 10 mg via INTRAMUSCULAR
  Filled 2017-01-31: qty 1

## 2017-01-31 NOTE — ED Provider Notes (Signed)
[facility] Note   CSN: [id number] Arrival date & time: 01/31/17  1703  By signing my name below, I, Mackenzie Serrano, attest that this documentation has been prepared under the direction and in the presence of non-physician practitioner, Graciella Freer, PA-C. Electronically Signed: Nelwyn Serrano, Scribe. 01/31/2017. 5:35 PM.  History   Chief Complaint Chief Complaint  Patient presents with  . Insect Bite   The history is provided by the patient. No language interpreter was used.    HPI Comments:  Mackenzie Serrano is an otherwise healthy 31 y.o. female who presents to the Emergency Department complaining of worsening rash to her left arm after onset >1 week. Pt was seen previously for her symptoms, which were initially on her right arm and back, and given Kenalog. She has been compliant with her prescribed medication with some relief to her right arm, but new areas of erythema have formed on her left arm. She reports associated color change, drainage, pain, and itching to the area. Pt has also tried OTC benedryl and cortisone cream for her symptoms with no relief. She reports she has experienced similar symptoms before after a bug bite. Denies any fever, facial swelling, LE swelling or any other symptoms.   History reviewed. No pertinent past medical history.  There are no active problems to display for this patient.   Past Surgical History:  Procedure Laterality Date  . ELBOW FRACTURE SURGERY      OB History    No data available       Home Medications    Prior to Admission medications   Medication Sig Start Date End Date Taking? Authorizing Provider  cephALEXin (KEFLEX) 500 MG capsule Take 1 capsule (500 mg total) by mouth 4 (four) times daily. 01/31/17   Maxwell Caul, PA-C  ibuprofen (ADVIL,MOTRIN) 800 MG tablet Take 1 tablet (800 mg total) by mouth 3 (three) times daily. 12/24/12   Glynn Octave, MD  predniSONE (DELTASONE) 10 MG tablet Take 2 tablets  (20 mg total) by mouth daily. 01/31/17 02/04/17  Maxwell Caul, PA-C  traMADol (ULTRAM) 50 MG tablet Take 1 tablet (50 mg total) by mouth every 6 (six) hours as needed. 01/19/15   Joycie Peek, PA-C  triamcinolone cream (KENALOG) 0.1 % Apply 1 application topically 2 (two) times daily. 01/24/17   Hayden Rasmussen, NP    Family History No family history on file.  Social History Social History  Substance Use Topics  . Smoking status: Never Smoker  . Smokeless tobacco: Never Used  . Alcohol use Yes     Allergies   Patient has no known allergies.   Review of Systems Review of Systems  Constitutional: Negative for chills and fever.  HENT: Negative for congestion, facial swelling, rhinorrhea and sore throat.   Eyes: Negative for visual disturbance.  Respiratory: Negative for cough and shortness of breath.   Cardiovascular: Negative for chest pain and leg swelling.  Gastrointestinal: Negative for abdominal pain, blood in stool, diarrhea, nausea and vomiting.  Genitourinary: Negative for dysuria and hematuria.  Musculoskeletal: Negative for back pain and neck pain.  Skin: Positive for color change, rash and wound.       Positive for Itching  Neurological: Negative for dizziness, weakness, numbness and headaches.  Psychiatric/Behavioral: Negative for confusion.  All other systems reviewed and are negative.    Physical Exam Updated Vital Signs BP (!) 138/101   Pulse 94   Temp 99 F (37.2 C) (Oral)   Resp 20  LMP 01/24/2017   SpO2 100%   Physical Exam  Constitutional: She is oriented to person, place, and time. She appears well-developed and well-nourished.  HENT:  Head: Normocephalic and atraumatic.  Mouth/Throat: Oropharynx is clear and moist and mucous membranes are normal.  No angioedema of tongue. No lip swelling.  Eyes: Conjunctivae, EOM and lids are normal. Pupils are equal, round, and reactive to light.  Neck: Full passive range of motion without pain.    Cardiovascular: Normal rate, regular rhythm, normal heart sounds and normal pulses.   Pulmonary/Chest: Breath sounds normal. No accessory muscle usage. No respiratory distress.  Musculoskeletal: Normal range of motion.  Neurological: She is alert and oriented to person, place, and time.  Skin: Skin is warm and dry. Capillary refill takes less than 2 seconds.  Left forearm with diffuse erythema, warmth and induration to the dorsal aspect (see photo). No fluctuance noted. Multiple diffuse scattered urticaria to back and left upper extremity.  Multiple scattered insect bites to LUE. No linear bites, no bites noted to webspaces of digits.   Psychiatric: She has a normal mood and affect. Her speech is normal.  Nursing note and vitals reviewed.        ED Treatments / Results  DIAGNOSTIC STUDIES:  Oxygen Saturation is 100% on RA, normal by my interpretation.    COORDINATION OF CARE:  5:51 PM Discussed treatment plan with pt at bedside which includes mapping the area, benedryl, prednisone, and an abx and pt agreed to plan.  Labs (all labs ordered are listed, but only abnormal results are displayed) Labs Reviewed - No data to display  EKG  EKG Interpretation None       Radiology No results found.  Procedures Procedures (including critical care time)  Medications Ordered in ED Medications  dexamethasone (DECADRON) injection 10 mg (10 mg Intramuscular Given 01/31/17 1817)  diphenhydrAMINE (BENADRYL) injection 25 mg (25 mg Intramuscular Given 01/31/17 1818)     Initial Impression / Assessment and Plan / ED Course  I have reviewed the triage vital signs and the nursing notes.  Pertinent labs & imaging results that were available during my care of the patient were reviewed by me and considered in my medical decision making (see chart for details).     31 year old woman who presents today for a recheck for insect bite 1 week ago. Was seen at urgent care 1 week ago and  prescribed Kenalog cream and told to follow-up in a week for further reevaluation. Patient reports she has been using the Kenalog cream for about 3-4 days and has not noticed any improvement. She still has multiple diffusely scattered urticaria to the left upper extremity patient also was new right forearm swelling and erythema. States this began about 3 days ago. Physical exam concerning for cellulitis of right forearm. Plan to give Benadryl and Decadron in the department for treatment of urticaria.   7:02 PM: Patient still reporting no throat swelling oral swelling or lip swelling. Still having mild itching associated with scattered urticaria on arm. Stable for discharge at this time. Will provide patient antibiotics for cellulitis. Instructions to return to the emergency department in 3 days for recheck of cellulitis site. Strict return precautions discussed. Patient expresses understanding and agreement  Final Clinical Impressions(s) / ED Diagnoses   Final diagnoses:  Cellulitis of right upper extremity    New Prescriptions Discharge Medication List as of 01/31/2017  7:10 PM    START taking these medications   Details  cephALEXin (KEFLEX) 500  MG capsule Take 1 capsule (500 mg total) by mouth 4 (four) times daily., Starting Thu 01/31/2017, Print    predniSONE (DELTASONE) 10 MG tablet Take 2 tablets (20 mg total) by mouth daily., Starting Thu 01/31/2017, Until Mon 02/04/2017, Print      I personally performed the services described in this documentation, which was scribed in my presence. The recorded information has been reviewed and is accurate.     Maxwell Caul, PA-C 02/01/17 2212    Vanetta Mulders, MD 02/02/17 1556

## 2017-01-31 NOTE — ED Triage Notes (Signed)
Insect bites. Here for recheck.

## 2017-01-31 NOTE — Discharge Instructions (Signed)
Take antibiotics as directed.  Take Benadryl which can buy over-the-counter for relief of itching.  Take prednisone as directed.  Return the emergency Department 3-4 days for reevaluation of the right arm to make sure it is improving.  Return sooner for any worsening fever, redness, swelling, pain or any other worsening or concerning symptoms

## 2017-02-03 ENCOUNTER — Emergency Department (HOSPITAL_BASED_OUTPATIENT_CLINIC_OR_DEPARTMENT_OTHER)
Admission: EM | Admit: 2017-02-03 | Discharge: 2017-02-03 | Disposition: A | Discharge status: Home / Self Care | Payer: Medicaid Other | Attending: Emergency Medicine | Admitting: Emergency Medicine

## 2017-02-03 ENCOUNTER — Encounter (HOSPITAL_BASED_OUTPATIENT_CLINIC_OR_DEPARTMENT_OTHER): Payer: Self-pay

## 2017-02-03 DIAGNOSIS — Z5189 Encounter for other specified aftercare: Secondary | ICD-10-CM

## 2017-02-03 DIAGNOSIS — Z4800 Encounter for change or removal of nonsurgical wound dressing: Secondary | ICD-10-CM | POA: Insufficient documentation

## 2017-02-03 NOTE — ED Triage Notes (Signed)
Pt here for cellulitis recheck of left arm. Pt sts taking abx and steroid. Pts left arm much improved.

## 2017-02-03 NOTE — Discharge Instructions (Signed)
Please return to the emergency department if you develop any new or worsening symptoms including fever, increasing pain, warmth, redness, swelling, red streaking from the area.

## 2017-02-03 NOTE — ED Provider Notes (Signed)
[facility]   CSN: [id number] Arrival date & time: 02/03/17  1421     History   Chief Complaint Chief Complaint  Patient presents with  . Wound Check    HPI Mackenzie Serrano is a 31 y.o. female with recent history of cellulitis to bilateral arms presents for wound check. Patient was seen 2 days ago for cellulitis from probable insect bites. Patient has been taking Keflex and prednisone with good relief of symptoms. Patient states the area of warmth and redness is much smaller. Patient denies any pain or fevers.  HPI  History reviewed. No pertinent past medical history.  There are no active problems to display for this patient.   Past Surgical History:  Procedure Laterality Date  . ELBOW FRACTURE SURGERY      OB History    No data available       Home Medications    Prior to Admission medications   Medication Sig Start Date End Date Taking? Authorizing Provider  cephALEXin (KEFLEX) 500 MG capsule Take 1 capsule (500 mg total) by mouth 4 (four) times daily. 01/31/17   Maxwell Caul, PA-C  ibuprofen (ADVIL,MOTRIN) 800 MG tablet Take 1 tablet (800 mg total) by mouth 3 (three) times daily. 12/24/12   Glynn Octave, MD  predniSONE (DELTASONE) 10 MG tablet Take 2 tablets (20 mg total) by mouth daily. 01/31/17 02/04/17  Maxwell Caul, PA-C  traMADol (ULTRAM) 50 MG tablet Take 1 tablet (50 mg total) by mouth every 6 (six) hours as needed. 01/19/15   Joycie Peek, PA-C  triamcinolone cream (KENALOG) 0.1 % Apply 1 application topically 2 (two) times daily. 01/24/17   Hayden Rasmussen, NP    Family History No family history on file.  Social History Social History  Substance Use Topics  . Smoking status: Never Smoker  . Smokeless tobacco: Never Used  . Alcohol use Yes     Allergies   Patient has no known allergies.   Review of Systems Review of Systems  Constitutional: Negative for fever.  Skin: Positive for color change and wound.      Physical Exam Updated Vital Signs BP 135/90 (BP Location: Right Arm)   Pulse 93   Temp 98.5 F (36.9 C) (Oral)   Resp 16   Ht 5' 5.5" (1.664 m)   Wt 61.2 kg   LMP 01/24/2017   SpO2 100%   BMI 22.12 kg/m   Physical Exam  Constitutional: She appears well-developed and well-nourished. No distress.  HENT:  Head: Normocephalic and atraumatic.  Mouth/Throat: Oropharynx is clear and moist. No oropharyngeal exudate.  Eyes: Conjunctivae are normal. Pupils are equal, round, and reactive to light. Right eye exhibits no discharge. Left eye exhibits no discharge. No scleral icterus.  Neck: Normal range of motion. Neck supple. No thyromegaly present.  Cardiovascular: Normal rate, regular rhythm, normal heart sounds and intact distal pulses.  Exam reveals no gallop and no friction rub.   No murmur heard. Pulmonary/Chest: Effort normal and breath sounds normal. No stridor. No respiratory distress. She has no wheezes. She has no rales.  Musculoskeletal: She exhibits no edema.  Lymphadenopathy:    She has no cervical adenopathy.  Neurological: She is alert. Coordination normal.  Skin: Skin is warm and dry. No rash noted. She is not diaphoretic. No pallor.  Few satellite areas of erythema to the dorsal forearm surrounding mild areas of erythema; no extensive area of erythema in comparison as shown in the photo at last visit neck;  no tenderness or warmth No erythema, tenderness, or warmth to right forearm  Psychiatric: She has a normal mood and affect.  Nursing note and vitals reviewed.    ED Treatments / Results  Labs (all labs ordered are listed, but only abnormal results are displayed) Labs Reviewed - No data to display  EKG  EKG Interpretation None       Radiology No results found.  Procedures Procedures (including critical care time)  Medications Ordered in ED Medications - No data to display   Initial Impression / Assessment and Plan / ED Course  I have reviewed  the triage vital signs and the nursing notes.  Pertinent labs & imaging results that were available during my care of the patient were reviewed by me and considered in my medical decision making (see chart for details).     Patient returns for check of cellulitis. The region appears to be well-healing and infection appears to be resolving. Patient symptoms improved from prior visit. Afebrile and hemodynamically stable. Pt is instructed to continue with home care, prednisone, and antibiotics. Pt has a good understanding of return precautions and is safe for discharge at this time.   Final Clinical Impressions(s) / ED Diagnoses   Final diagnoses:  Visit for wound check    New Prescriptions New Prescriptions   No medications on file     Emi Holes, Cordelia Poche 02/03/17 1511    Maia Plan, MD 02/04/17 [phone]
